# Patient Record
Sex: Female | Born: 2000 | Race: White | Hispanic: No | Marital: Single | Smoking: Never smoker
Health system: Southern US, Community
[De-identification: ages and names within clinical notes are randomized; demographics above are authoritative.]

---
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
## 2019-05-12 IMAGING — CT CT RENAL STONE PROTOCOL
2 of 4 series · 17 of 46 positions shown, 19 images · non-contrast
Comparison: None.

CLINICAL DATA: Lower abdominal pain and cramping 1 week. Vaginal
discharge today.

EXAM:
CT ABDOMEN AND PELVIS WITHOUT CONTRAST
TECHNIQUE: Multidetector CT imaging of the abdomen and pelvis was performed
following the standard protocol without IV contrast.

[Series 2: axial st · axial · 0.76mm/px · z∈[+518,+938]mm · 14 of 92 slices shown, 16 images]
[im 4/92  soft-tissue]
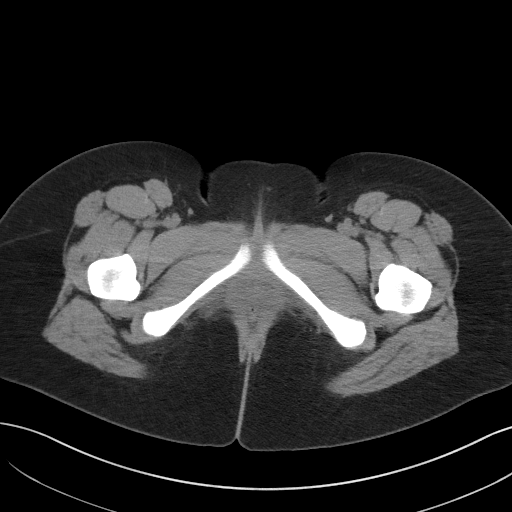
[im 4/92  bone]
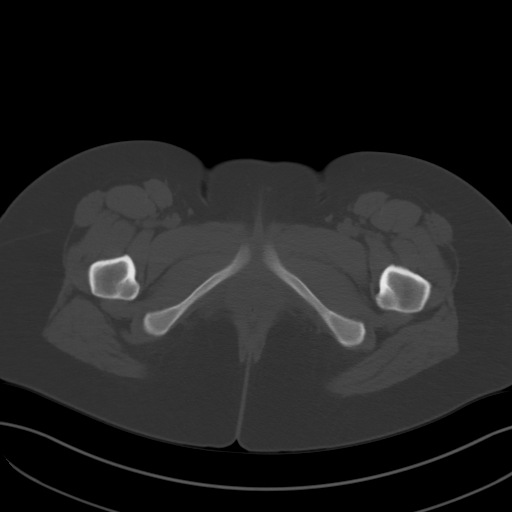
[im 12/92  soft-tissue]
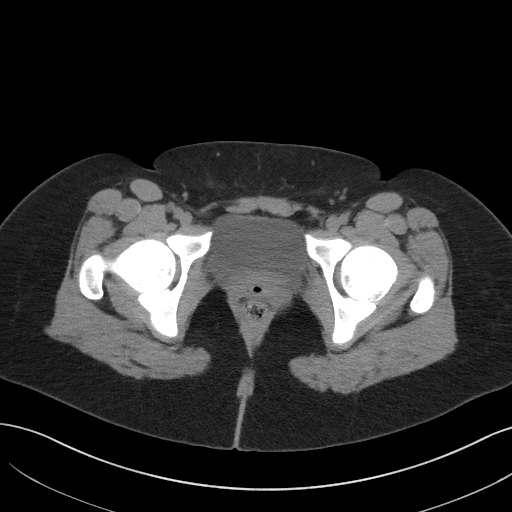
[im 16/92  soft-tissue]
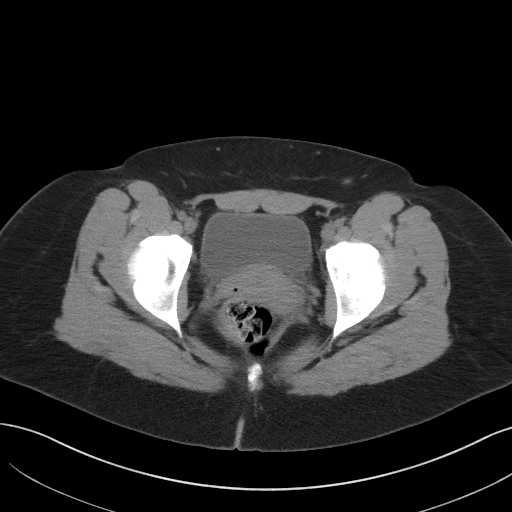
[im 24/92  soft-tissue]
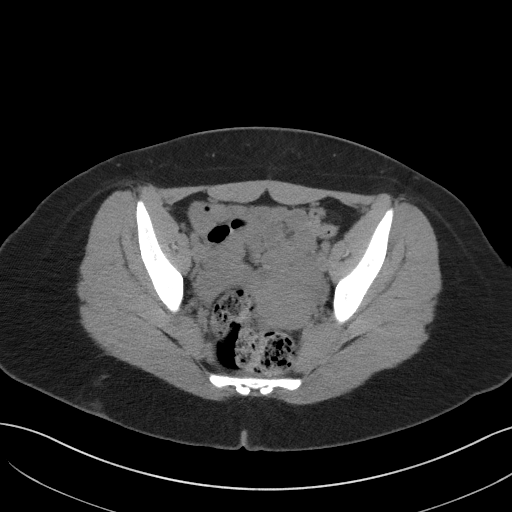
[im 32/92  soft-tissue]
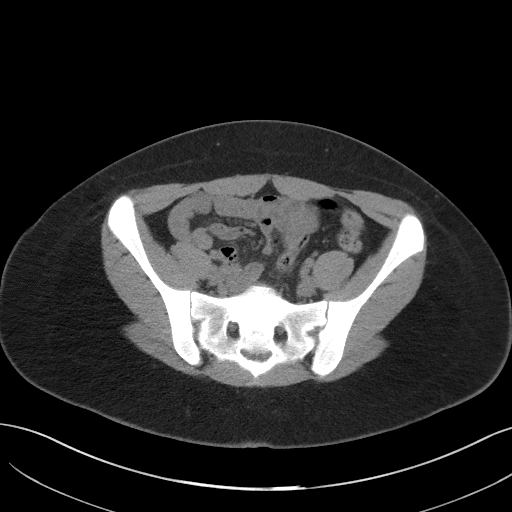
[im 36/92  soft-tissue]
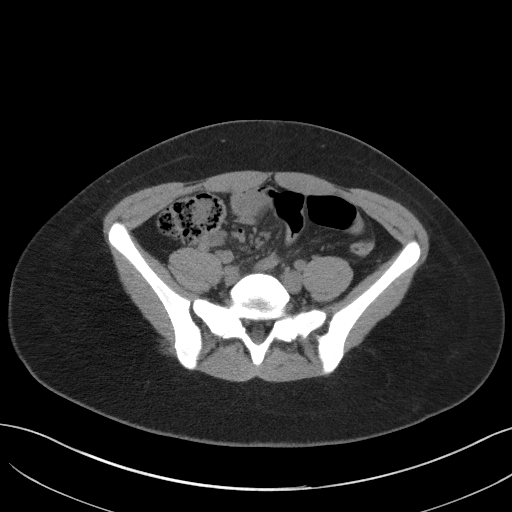
[im 44/92  soft-tissue]
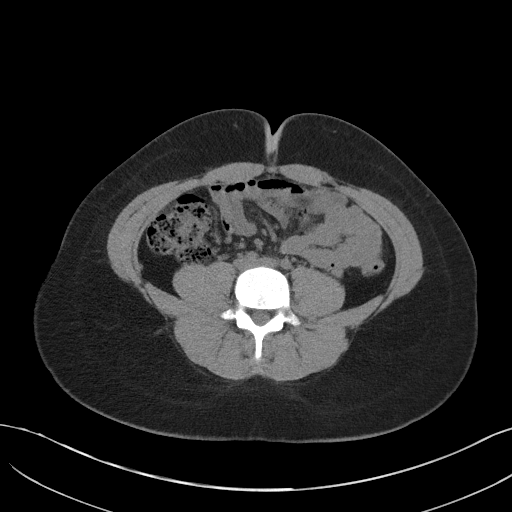
[im 48/92  soft-tissue]
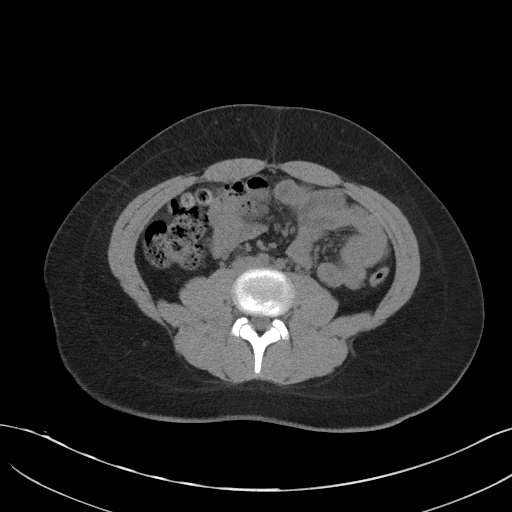
[im 56/92  soft-tissue]
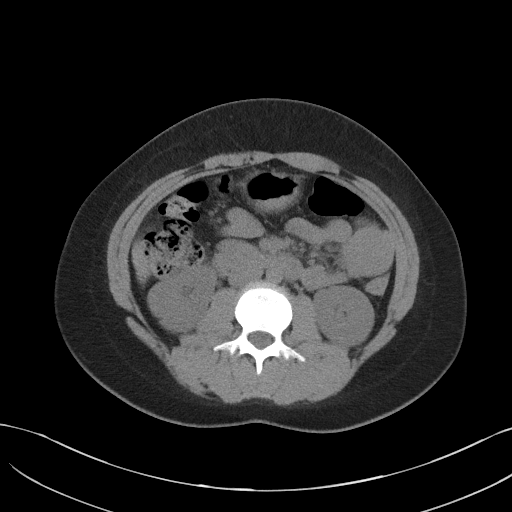
[im 56/92  bone]
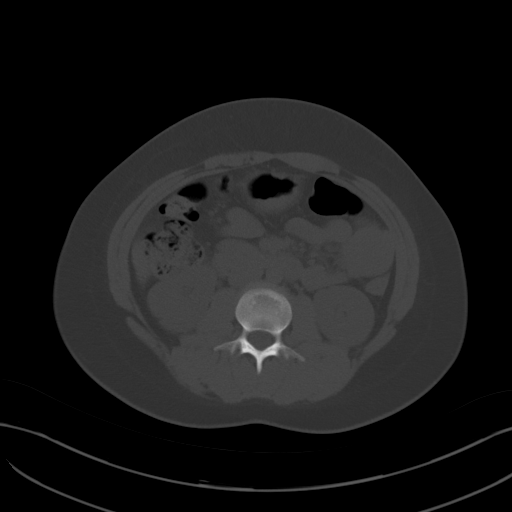
[im 60/92  soft-tissue]
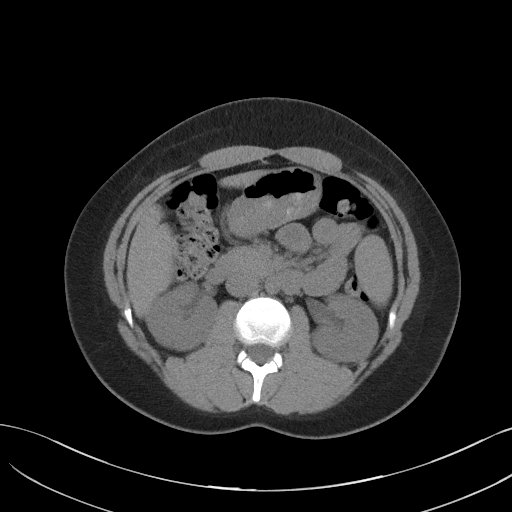
[im 68/92  soft-tissue]
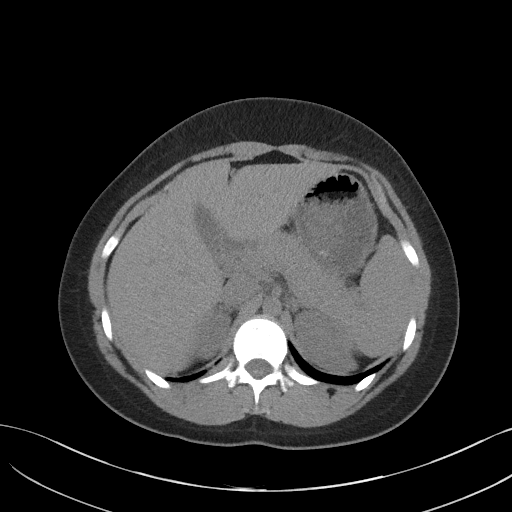
[im 76/92  soft-tissue]
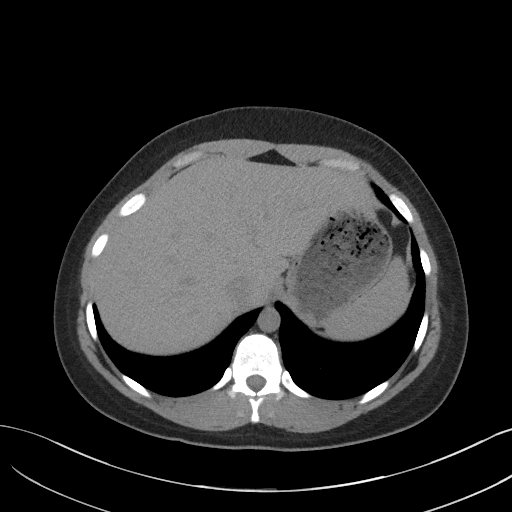
[im 80/92  soft-tissue]
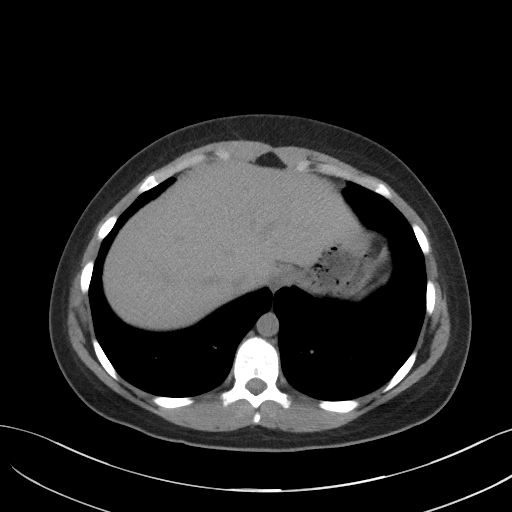
[im 88/92  soft-tissue]
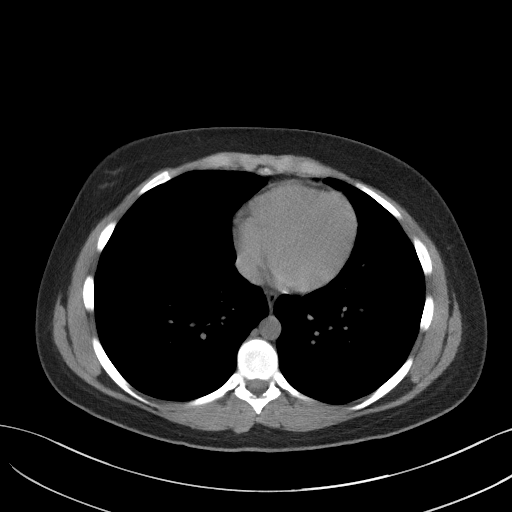

[Series 5: coronal st · coronal · 0.83mm/px · 3 of 91 slices shown]
[im 31/91  soft-tissue]
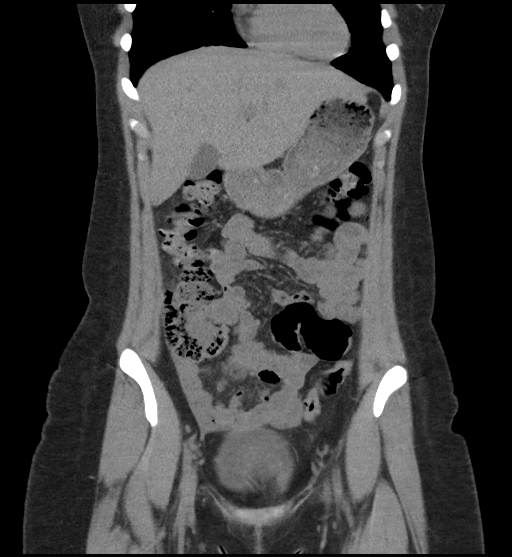
[im 41/91  soft-tissue]
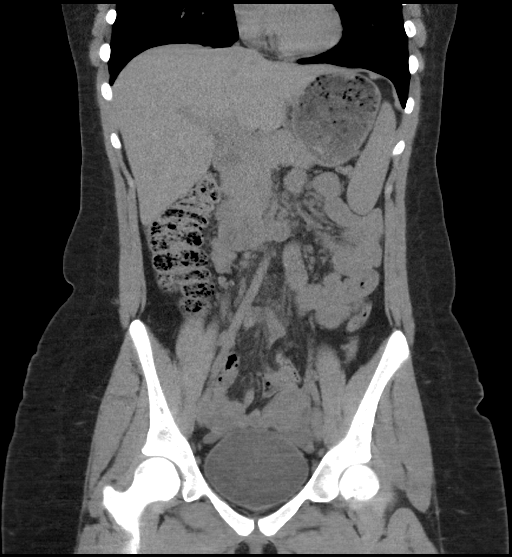
[im 51/91  soft-tissue]
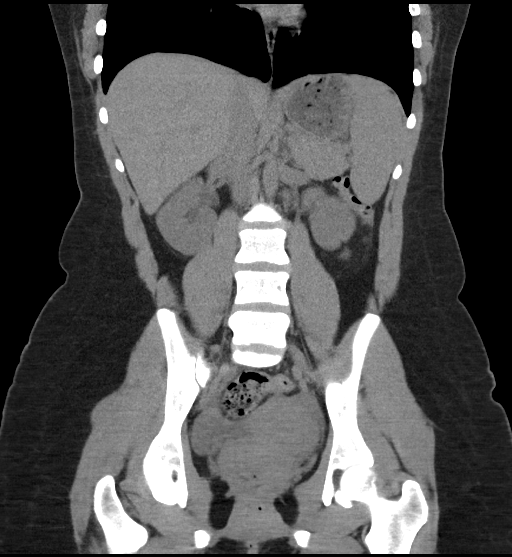

[17 of 46 positions shown; findings below may reference images not displayed]

FINDINGS: Lower chest: Normal.

Hepatobiliary: Normal.

Pancreas: Normal.

Spleen: Normal.

Adrenals/Urinary Tract: Adrenal glands are normal. Kidneys are
normal in size. Without hydronephrosis or nephrolithiasis. Ureters
and bladder are normal.

Stomach/Bowel: Stomach and small bowel are within normal. Appendix
is normal. Colon is normal. Mild fecal retention over the rectum.

Vascular/Lymphatic: Normal.

Reproductive: Uterus is just left of midline. Ovaries are
unremarkable. No free pelvic fluid.

Other: No free fluid or focal inflammatory change.

Musculoskeletal: Normal.
IMPRESSION: No acute findings in the abdomen/pelvis.
